# Patient Record
Sex: Male | Born: 2000 | Race: Black or African American | Hispanic: No | Marital: Single | Smoking: Never smoker
Health system: Southern US, Community
[De-identification: ages and names within clinical notes are randomized; demographics above are authoritative.]

## PROBLEM LIST (undated history)

## (undated) DIAGNOSIS — F909 Attention-deficit hyperactivity disorder, unspecified type: Secondary | ICD-10-CM

## (undated) HISTORY — PX: ELBOW FRACTURE SURGERY: SHX616

---
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
## 2021-11-30 ENCOUNTER — Encounter: Payer: Self-pay | Admitting: Emergency Medicine

## 2021-11-30 ENCOUNTER — Emergency Department: Payer: 59

## 2021-11-30 ENCOUNTER — Other Ambulatory Visit: Payer: Self-pay

## 2021-11-30 ENCOUNTER — Emergency Department
Admission: EM | Admit: 2021-11-30 | Discharge: 2021-11-30 | Disposition: A | Discharge status: Home / Self Care | Payer: 59 | Attending: Emergency Medicine | Admitting: Emergency Medicine

## 2021-11-30 DIAGNOSIS — S4991XA Unspecified injury of right shoulder and upper arm, initial encounter: Secondary | ICD-10-CM | POA: Diagnosis present

## 2021-11-30 DIAGNOSIS — S43004A Unspecified dislocation of right shoulder joint, initial encounter: Secondary | ICD-10-CM | POA: Insufficient documentation

## 2021-11-30 DIAGNOSIS — M542 Cervicalgia: Secondary | ICD-10-CM | POA: Insufficient documentation

## 2021-11-30 DIAGNOSIS — Y92410 Unspecified street and highway as the place of occurrence of the external cause: Secondary | ICD-10-CM | POA: Diagnosis not present

## 2021-11-30 MED ORDER — MORPHINE SULFATE (PF) 4 MG/ML IV SOLN
4.0000 mg | Freq: Once | INTRAVENOUS | Status: AC
  Administered 2021-11-30: 4 mg via INTRAVENOUS
  Filled 2021-11-30: qty 1

## 2021-11-30 MED ORDER — HYDROMORPHONE HCL 1 MG/ML IJ SOLN
1.0000 mg | Freq: Once | INTRAMUSCULAR | Status: AC
  Administered 2021-11-30: 1 mg via INTRAVENOUS
  Filled 2021-11-30: qty 1
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
## 2022-02-20 ENCOUNTER — Encounter: Payer: Self-pay | Admitting: Intensive Care

## 2022-02-20 ENCOUNTER — Other Ambulatory Visit: Payer: Self-pay

## 2022-02-20 ENCOUNTER — Emergency Department
Admission: EM | Admit: 2022-02-20 | Discharge: 2022-02-20 | Disposition: A | Discharge status: Home / Self Care | Payer: BC Managed Care – PPO | Attending: Emergency Medicine | Admitting: Emergency Medicine

## 2022-02-20 DIAGNOSIS — M436 Torticollis: Secondary | ICD-10-CM | POA: Diagnosis not present

## 2022-02-20 DIAGNOSIS — M542 Cervicalgia: Secondary | ICD-10-CM | POA: Diagnosis present

## 2022-02-20 MED ORDER — BACLOFEN 10 MG PO TABS: 10.0000 mg | ORAL_TABLET | Freq: Three times a day (TID) | ORAL | 0 refills | Status: AC

## 2022-02-20 MED ORDER — MELOXICAM 15 MG PO TABS: 15.0000 mg | ORAL_TABLET | Freq: Every day | ORAL | 2 refills | Status: AC

## 2022-02-20 NOTE — ED Provider Notes (Signed)
[facility] Provider Note    Event Date/Time   First MD Initiated Contact with Patient 02/20/22 1553     (approximate)   History   Neck Pain   HPI  Travis Holt is a 21 y.o. male presents emergency department complaining of neck pain.  Patient states chronic issues Werts across the shoulders and up the back of his neck sometimes in the lower back.  This is a lot of totes at work.  Missed work today due to the pain.  Does spend a lot of time on his phone.  Denies numbness or tingling      Physical Exam   Triage Vital Signs: ED Triage Vitals  Enc Vitals Group     BP 02/20/22 1551 (!) 139/94     Pulse Rate 02/20/22 1551 70     Resp 02/20/22 1551 16     Temp 02/20/22 1551 98.3 F (36.8 C)     Temp Source 02/20/22 1551 Oral     SpO2 02/20/22 1551 100 %     Weight 02/20/22 1547 165 lb (74.8 kg)     Height 02/20/22 1547 6' (1.829 m)     Head Circumference --      Peak Flow --      Pain Score 02/20/22 1547 4     Pain Loc --      Pain Edu? --      Excl. in GC? --     Most recent vital signs: Vitals:   02/20/22 1551  BP: (!) 139/94  Pulse: 70  Resp: 16  Temp: 98.3 F (36.8 C)  SpO2: 100%     General: Awake, no distress.   CV:  Good peripheral perfusion. regular rate and  rhythm Resp:  Normal effort.  Abd:  No distention.   Other:      ED Results / Procedures / Treatments   Labs (all labs ordered are listed, but only abnormal results are displayed) Labs Reviewed - No data to display   EKG     RADIOLOGY     PROCEDURES:   Procedures   MEDICATIONS ORDERED IN ED: Medications - No data to display   IMPRESSION / MDM / ASSESSMENT AND PLAN / ED COURSE  I reviewed the triage vital signs and the nursing notes.                              Differential diagnosis includes, but is not limited to, torticollis, muscle strain, cervical radiculopathy  Patient's presentation is most consistent with acute, uncomplicated  illness.   Physical exam shows the musculature to be tender, no bony tenderness.  Grips are equal bilaterally.  Full range of motion.  Due to this I did place the patient on anti-inflammatory muscle relaxer.  He is apply ice.  Follow-up with orthopedics if not improving 1 week.  Return if worsening.  He is given work note discharge stable condition      FINAL CLINICAL IMPRESSION(S) / ED DIAGNOSES   Final diagnoses:  Torticollis, acute     Rx / DC Orders   ED Discharge Orders          Ordered    meloxicam (MOBIC) 15 MG tablet  Daily        02/20/22 1554    baclofen (LIORESAL) 10 MG tablet  3 times daily        02/20/22 1554  Note:  This document was prepared using Dragon voice recognition software and may include unintentional dictation errors.    Faythe Ghee, PA-C 02/20/22 1602    Minna Antis, MD 02/20/22 2010
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
## 2022-10-28 ENCOUNTER — Emergency Department: Admission: EM | Admit: 2022-10-28 | Payer: No Typology Code available for payment source | Source: Home / Self Care

## 2022-11-01 ENCOUNTER — Encounter: Payer: Self-pay | Admitting: Intensive Care

## 2022-11-01 ENCOUNTER — Emergency Department
Admission: EM | Admit: 2022-11-01 | Discharge: 2022-11-01 | Disposition: A | Discharge status: Home / Self Care | Payer: 59 | Attending: Emergency Medicine | Admitting: Emergency Medicine

## 2022-11-01 ENCOUNTER — Other Ambulatory Visit: Payer: Self-pay

## 2022-11-01 DIAGNOSIS — B009 Herpesviral infection, unspecified: Secondary | ICD-10-CM

## 2022-11-01 DIAGNOSIS — K1379 Other lesions of oral mucosa: Secondary | ICD-10-CM | POA: Diagnosis present

## 2022-11-01 MED ORDER — ACYCLOVIR 400 MG PO TABS: 400.0000 mg | ORAL_TABLET | Freq: Three times a day (TID) | ORAL | 0 refills | Status: AC

## 2022-11-01 NOTE — ED Triage Notes (Signed)
Patient here for STD check. Denies symptoms in triage

## 2022-11-01 NOTE — Discharge Instructions (Addendum)
Given the possible lesion in mouth we have started you on some acyclovir to see if this can help clear it up if it is related to the herpes however this will remain in your blood and is important that if you develop any active ulcers that you are avoiding sexual practices and you can get a follow-up appointment to get restarted on more antiviral medications.  You can discuss this further with your primary care doctor return to the ER if develop fevers, worsening symptoms or any other concerns

## 2022-11-01 NOTE — ED Provider Notes (Signed)
[facility] Provider Note    Event Date/Time   First MD Initiated Contact with Patient 11/01/22 [phone]     (approximate)   History   Exposure to STD   HPI  Travis Holt is a 22 y.o. male  who comes in with STD check.  Patient reports being seen at [facility] for a full STD workup.  He reports having a lesion on the top of his mouth.  He states that he thought it could have just been from his toothbrush injuring it but it has been there since 2/14.  He had negative gonorrhea, chlamydia, HIV, syphilis however his HSV 1 and 2 antibody in his blood were positive.  He denies any genital lesions lymphadenopathy fevers or any other concerns.   Physical Exam   Triage Vital Signs: ED Triage Vitals [11/01/22 0707]  Enc Vitals Group     BP      Pulse      Resp      Temp      Temp src      SpO2      Weight 155 lb (70.3 kg)     Height 6' (1.829 m)     Head Circumference      Peak Flow      Pain Score 0     Pain Loc      Pain Edu?      Excl. in [place]?     Most recent vital signs: There were no vitals filed for this visit.   General: Awake, no distress.  CV:  Good peripheral perfusion.  Resp:  Normal effort.  Abd:  No distention.  Other:  Genitals were examined with chaperone in the room.  No genital lesions were noted.  No discharge from the penile urethra.  No lymphadenopathy noted.  Small lesion noted behind the front tooth in his mouth   ED Results / Procedures / Treatments     IMPRESSION / MDM / [place] / ED COURSE  I reviewed the triage vital signs and the nursing notes.   Patient's presentation is most consistent with acute, uncomplicated illness.   Visit was reviewed from 2/22.  Blood work was also reviewed from this visit.  Gonorrhea and Chlamydia test were -6 days ago.  Urine is negative.  HIV is negative.  However his HSV testing is positive.  Had a lengthy discussion with patient that this could mean infectivity at any point  in his life and treatment would only be done if he had any active ulcers.  Initially denied any ulcers but then later did report a potential ulcer on the roof of his mouth that has been there for over 2 weeks initially thought to be an injury from his toothbrush.  We discussed starting acyclovir and he expressed understanding felt comfortable with this.  He understands that if he develops any other flareups that he should be avoiding sexual contact and can seek out care to get restarted on antiviral medications if necessary.  He had a primary care doctor appointment in May that he can discuss further with them about.  He expressed understanding and felt comfortable with discharge home  I discussed the provisional nature of ED diagnosis, the treatment so far, the ongoing plan of care, follow up appointments and return precautions with the patient and any family or support people present. They expressed understanding and agreed with the plan, discharged home.      FINAL CLINICAL IMPRESSION(S) /  ED DIAGNOSES   Final diagnoses:  Herpes     Rx / DC Orders   ED Discharge Orders          Ordered    acyclovir (ZOVIRAX) 400 MG tablet  3 times daily        11/01/22 0747             Note:  This document was prepared using Dragon voice recognition software and may include unintentional dictation errors.   Vanessa Blythe, MD 11/01/22 [phone]

## 2022-11-06 ENCOUNTER — Telehealth: Payer: Self-pay | Admitting: Family Medicine

## 2022-11-06 NOTE — Telephone Encounter (Signed)
Patient's Step Mother called wanting to ask questions about Herpes.
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
## 2023-01-24 ENCOUNTER — Ambulatory Visit: Payer: Self-pay | Admitting: Family Medicine

## 2023-06-17 ENCOUNTER — Emergency Department
Admission: EM | Admit: 2023-06-17 | Discharge: 2023-06-17 | Disposition: A | Discharge status: Home / Self Care | Payer: 59 | Attending: Emergency Medicine | Admitting: Emergency Medicine

## 2023-06-17 ENCOUNTER — Emergency Department: Payer: Self-pay

## 2023-06-17 ENCOUNTER — Other Ambulatory Visit: Payer: Self-pay

## 2023-06-17 DIAGNOSIS — X501XXA Overexertion from prolonged static or awkward postures, initial encounter: Secondary | ICD-10-CM | POA: Insufficient documentation

## 2023-06-17 DIAGNOSIS — S43014A Anterior dislocation of right humerus, initial encounter: Secondary | ICD-10-CM | POA: Diagnosis not present

## 2023-06-17 DIAGNOSIS — S43004A Unspecified dislocation of right shoulder joint, initial encounter: Secondary | ICD-10-CM | POA: Insufficient documentation

## 2023-06-17 DIAGNOSIS — M24311 Pathological dislocation of right shoulder, not elsewhere classified: Secondary | ICD-10-CM | POA: Diagnosis not present

## 2023-06-17 DIAGNOSIS — S4991XA Unspecified injury of right shoulder and upper arm, initial encounter: Secondary | ICD-10-CM | POA: Diagnosis not present

## 2023-06-17 MED ORDER — LIDOCAINE HCL 1 % IJ SOLN
20.0000 mL | Freq: Once | INTRAMUSCULAR | Status: AC
  Administered 2023-06-17: 20 mL
  Filled 2023-06-17: qty 20

## 2023-06-17 NOTE — Discharge Instructions (Signed)
You have been given an orthopedic follow-up referral.

## 2023-06-17 NOTE — ED Provider Notes (Signed)
[facility] Provider Note  Patient Contact: 5:08 PM (approximate)   History   Shoulder Injury   HPI  Travis Holt is a 22 y.o. male presents to the emergency department with right shoulder dislocation.  Patient states that he has dislocated his shoulder multiple times in the past.  Patient reports that he was reaching for an object today when dislocation occurred.  No falls or other mechanisms of trauma.      Physical Exam   Triage Vital Signs: ED Triage Vitals  Encounter Vitals Group     BP 06/17/23 1628 (!) 136/93     Systolic BP Percentile --      Diastolic BP Percentile --      Pulse Rate 06/17/23 1628 (!) 101     Resp 06/17/23 1628 18     Temp 06/17/23 1628 98 F (36.7 C)     Temp src --      SpO2 06/17/23 1628 100 %     Weight --      Height --      Head Circumference --      Peak Flow --      Pain Score 06/17/23 1626 6     Pain Loc --      Pain Education --      Exclude from Growth Chart --     Most recent vital signs: Vitals:   06/17/23 1628  BP: (!) 136/93  Pulse: (!) 101  Resp: 18  Temp: 98 F (36.7 C)  SpO2: 100%     General: Alert and in no acute distress. Eyes:  PERRL. EOMI. Head: No acute traumatic findings ENT:      Nose: No congestion/rhinnorhea.      Mouth/Throat: Mucous membranes are moist. Neck: No stridor. No cervical spine tenderness to palpation. Cardiovascular:  Good peripheral perfusion Respiratory: Normal respiratory effort without tachypnea or retractions. Lungs CTAB. Good air entry to the bases with no decreased or absent breath sounds. Gastrointestinal: Bowel sounds 4 quadrants. Soft and nontender to palpation. No guarding or rigidity. No palpable masses. No distention. No CVA tenderness. Musculoskeletal: Full range of motion to all extremities.  Neurologic:  No gross focal neurologic deficits are appreciated.  Skin:   No rash noted Other:   ED Results / Procedures / Treatments   Labs (all  labs ordered are listed, but only abnormal results are displayed) Labs Reviewed - No data to display      RADIOLOGY  I personally viewed and evaluated these images as part of my medical decision making, as well as reviewing the written report by the radiologist.  ED Provider Interpretation: Anterior shoulder dislocation confirmed on initial x-ray   PROCEDURES:  Critical Care performed: No  Reduction of dislocation  Date/Time: 06/17/2023 5:13 PM  Performed by: Orvil Feil, PA-C Authorized by: Orvil Feil, PA-C  Consent: Verbal consent obtained. Consent given by: patient Patient understanding: patient states understanding of the procedure being performed Patient identity confirmed: verbally with patient Local anesthesia used: yes  Anesthesia: Local anesthesia used: yes Local Anesthetic: lidocaine 1% without epinephrine Anesthetic total: 10 mL  Sedation: Patient sedated: no       MEDICATIONS ORDERED IN ED: Medications  lidocaine (XYLOCAINE) 1 % (with pres) injection 20 mL (20 mLs Infiltration Given 06/17/23 1712)     IMPRESSION / MDM / ASSESSMENT AND PLAN / ED COURSE  I reviewed the triage vital signs and the nursing notes.  Assessment and plan: Shoulder Dislocation:   22 year old male presents to the emergency department with right shoulder dislocation confirmed on initial x-rays.  Shoulder reduction occurred in the emergency department without complication using lidocaine block and reduction technique.  Successful reduction confirmed on postreduction x-rays.  Tylenol and ibuprofen alternating recommended for lingering discomfort in shoulder immobilizerprior to discharge.  Recommended follow-up with orthopedics.   FINAL CLINICAL IMPRESSION(S) / ED DIAGNOSES   Final diagnoses:  Dislocation of right shoulder joint, initial encounter     Rx / DC Orders   ED Discharge Orders     None        Note:  This  document was prepared using Dragon voice recognition software and may include unintentional dictation errors.   Pia Mau [place], PA-C 06/17/23 1720    Loleta Rose, MD 06/17/23 2113
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
## 2023-10-21 DIAGNOSIS — B348 Other viral infections of unspecified site: Secondary | ICD-10-CM | POA: Diagnosis not present

## 2023-10-21 DIAGNOSIS — Z20822 Contact with and (suspected) exposure to covid-19: Secondary | ICD-10-CM | POA: Diagnosis not present

## 2023-10-21 DIAGNOSIS — L709 Acne, unspecified: Secondary | ICD-10-CM | POA: Diagnosis not present

## 2023-10-21 DIAGNOSIS — J069 Acute upper respiratory infection, unspecified: Secondary | ICD-10-CM | POA: Diagnosis not present

## 2023-10-21 DIAGNOSIS — Z2839 Other underimmunization status: Secondary | ICD-10-CM | POA: Diagnosis not present

## 2023-10-21 DIAGNOSIS — L309 Dermatitis, unspecified: Secondary | ICD-10-CM | POA: Diagnosis not present

## 2023-10-21 DIAGNOSIS — Z79899 Other long term (current) drug therapy: Secondary | ICD-10-CM | POA: Diagnosis not present

## 2023-10-21 DIAGNOSIS — R059 Cough, unspecified: Secondary | ICD-10-CM | POA: Diagnosis not present

## 2023-10-21 DIAGNOSIS — R0981 Nasal congestion: Secondary | ICD-10-CM | POA: Diagnosis not present

## 2023-10-21 DIAGNOSIS — J029 Acute pharyngitis, unspecified: Secondary | ICD-10-CM | POA: Diagnosis not present

## 2023-10-21 DIAGNOSIS — B974 Respiratory syncytial virus as the cause of diseases classified elsewhere: Secondary | ICD-10-CM | POA: Diagnosis not present

## 2024-01-13 DIAGNOSIS — R109 Unspecified abdominal pain: Secondary | ICD-10-CM | POA: Diagnosis not present

## 2024-06-02 DIAGNOSIS — F411 Generalized anxiety disorder: Secondary | ICD-10-CM | POA: Diagnosis not present

## 2024-06-02 DIAGNOSIS — Z13220 Encounter for screening for lipoid disorders: Secondary | ICD-10-CM | POA: Diagnosis not present

## 2024-06-02 DIAGNOSIS — L309 Dermatitis, unspecified: Secondary | ICD-10-CM | POA: Diagnosis not present

## 2024-06-02 DIAGNOSIS — K219 Gastro-esophageal reflux disease without esophagitis: Secondary | ICD-10-CM | POA: Diagnosis not present

## 2024-06-02 DIAGNOSIS — M24419 Recurrent dislocation, unspecified shoulder: Secondary | ICD-10-CM | POA: Diagnosis not present

## 2024-06-02 DIAGNOSIS — Z1389 Encounter for screening for other disorder: Secondary | ICD-10-CM | POA: Diagnosis not present

## 2024-06-02 DIAGNOSIS — Z1159 Encounter for screening for other viral diseases: Secondary | ICD-10-CM | POA: Diagnosis not present

## 2024-06-02 DIAGNOSIS — Z0000 Encounter for general adult medical examination without abnormal findings: Secondary | ICD-10-CM | POA: Diagnosis not present

## 2024-06-02 DIAGNOSIS — Z131 Encounter for screening for diabetes mellitus: Secondary | ICD-10-CM | POA: Diagnosis not present

## 2024-07-02 DIAGNOSIS — F411 Generalized anxiety disorder: Secondary | ICD-10-CM | POA: Diagnosis not present

## 2024-07-02 DIAGNOSIS — M24419 Recurrent dislocation, unspecified shoulder: Secondary | ICD-10-CM | POA: Diagnosis not present

## 2024-07-02 DIAGNOSIS — L309 Dermatitis, unspecified: Secondary | ICD-10-CM | POA: Diagnosis not present

## 2024-07-02 DIAGNOSIS — K219 Gastro-esophageal reflux disease without esophagitis: Secondary | ICD-10-CM | POA: Diagnosis not present

## 2024-07-02 DIAGNOSIS — Z0000 Encounter for general adult medical examination without abnormal findings: Secondary | ICD-10-CM | POA: Diagnosis not present

## 2024-07-09 DIAGNOSIS — L648 Other androgenic alopecia: Secondary | ICD-10-CM | POA: Diagnosis not present

## 2024-07-24 DIAGNOSIS — S8001XA Contusion of right knee, initial encounter: Secondary | ICD-10-CM | POA: Diagnosis not present

## 2024-07-24 DIAGNOSIS — S8391XA Sprain of unspecified site of right knee, initial encounter: Secondary | ICD-10-CM | POA: Diagnosis not present

## 2024-07-24 DIAGNOSIS — S8392XA Sprain of unspecified site of left knee, initial encounter: Secondary | ICD-10-CM | POA: Diagnosis not present

## 2024-07-24 DIAGNOSIS — S8002XA Contusion of left knee, initial encounter: Secondary | ICD-10-CM | POA: Diagnosis not present

## 2024-08-15 ENCOUNTER — Ambulatory Visit: Admitting: Nurse Practitioner

## 2024-09-16 ENCOUNTER — Ambulatory Visit: Admitting: Nurse Practitioner

## 2024-11-28 ENCOUNTER — Ambulatory Visit: Admitting: Nurse Practitioner
